# Patient Record
Sex: Female | Born: 1970 | Hispanic: No | Marital: Single | Smoking: Never smoker
Health system: Southern US, Community
[De-identification: ages and names within clinical notes are randomized; demographics above are authoritative.]

## PROBLEM LIST (undated history)

## (undated) DIAGNOSIS — N390 Urinary tract infection, site not specified: Secondary | ICD-10-CM

## (undated) HISTORY — DX: Urinary tract infection, site not specified: N39.0

---

## 2015-11-03 ENCOUNTER — Other Ambulatory Visit (HOSPITAL_COMMUNITY): Admission: AD | Admit: 2015-11-03 | Payer: Self-pay | Source: Ambulatory Visit | Admitting: Family Medicine

## 2015-11-03 ENCOUNTER — Emergency Department (INDEPENDENT_AMBULATORY_CARE_PROVIDER_SITE_OTHER)
Admission: EM | Admit: 2015-11-03 | Discharge: 2015-11-03 | Disposition: A | Discharge status: Home / Self Care | Payer: BLUE CROSS/BLUE SHIELD | Source: Home / Self Care | Attending: Family Medicine | Admitting: Family Medicine

## 2015-11-03 ENCOUNTER — Other Ambulatory Visit (HOSPITAL_COMMUNITY)
Admission: RE | Admit: 2015-11-03 | Discharge: 2015-11-03 | Disposition: A | Discharge status: Home / Self Care | Payer: BLUE CROSS/BLUE SHIELD | Source: Ambulatory Visit | Attending: Family Medicine | Admitting: Family Medicine

## 2015-11-03 DIAGNOSIS — N390 Urinary tract infection, site not specified: Secondary | ICD-10-CM | POA: Diagnosis present

## 2015-11-03 LAB — POCT URINALYSIS DIP (DEVICE)
BILIRUBIN URINE: NEGATIVE
Glucose, UA: NEGATIVE mg/dL
KETONES UR: NEGATIVE mg/dL
Nitrite: NEGATIVE
PH: 8.5 — AB (ref 5.0–8.0)
Protein, ur: NEGATIVE mg/dL
Specific Gravity, Urine: 1.015 (ref 1.005–1.030)
Urobilinogen, UA: 0.2 mg/dL (ref 0.0–1.0)

## 2015-11-03 MED ORDER — CEPHALEXIN 500 MG PO CAPS: 500.0000 mg | ORAL_CAPSULE | Freq: Four times a day (QID) | ORAL | Status: DC

## 2015-11-03 NOTE — Discharge Instructions (Signed)
Antibiotic Medicine Also AZO-Standard for urinary symptoms Antibiotic medicines are used to treat infections caused by bacteria. They work by injuring or killing the bacteria that is making you sick. HOW IS AN ANTIBIOTIC CHOSEN? An antibiotic is chosen based on many factors. To help your health care provider choose one for you, tell your health care provider if:  You have any allergies.  You are pregnant or plan to get pregnant.  You are breastfeeding.  You are taking any medicines. These include over-the-counter medicines, prescription medicines, and herbal remedies.  You have a medical condition or problem you have not already discussed. Your health care provider will also consider:  How often the medicine has to be taken.  Common side effects of the medicine.  The cost of the medicine.  The taste of the medicine. If you have questions about why an antibiotic was chosen, make sure to ask. FOR HOW LONG SHOULD I TAKE MY ANTIBIOTIC? Continue to take your antibiotic for as long as told by your health care provider. Do not stop taking it when you feel better. If you stop taking it too soon:  You may start to feel sick again.  Your infection may become harder to treat.  Complications may develop. WHAT IF I MISS A DOSE? Try not to miss any doses of medicine. If you miss a dose, take it as soon as possible. However, if it is almost time for the next dose:  If you are taking 2 doses per day, take the missed dose and the next dose 5 to 6 hours apart.  If you are taking 3 or more doses per day, take the missed dose and the next dose 2 to 4 hours apart, then go back to the normal schedule. If you cannot make up a missed dose, take the next scheduled dose on time. Then take the missed dose after you have taken all the doses as recommended by your health care provider, as if you had one more dose left. DO ANTIBIOTICS AFFECT BIRTH CONTROL? Birth control pills may not work while you are on  antibiotics. If you are taking birth control pills, continue taking them as usual and use a second form of birth control, such as a condom, to avoid unwanted pregnancy. Continue using the second form of birth control until you are finished with your current 1 month cycle of birth control pills. OTHER INFORMATION  If there is any medicine left over, throw it away.  Never take someone else's antibiotics.  Never take leftover antibiotics. SEEK MEDICAL CARE IF:  You get worse.  You do not feel better within a few days of starting the antibiotic medicine.  You vomit.  White patches appear in your mouth.  You have new joint pain that begins after starting the antibiotic.  You have new muscle aches that begin after starting the antibiotic.  You had a fever before starting the antibiotic and it returns.  You have any symptoms of an allergic reaction, such as an itchy rash. If this happens, stop taking the antibiotic. SEEK IMMEDIATE MEDICAL CARE IF:  Your urine turns dark or becomes blood-colored.  Your skin turns yellow.  You bruise or bleed easily.  You have severe diarrhea and abdominal cramps.  You have a severe headache.  You have signs of a severe allergic reaction, such as:  Trouble breathing.  Wheezing.  Swelling of the lips, tongue, or face.  Fainting.  Blisters on the skin or in the mouth. If you have  signs of a severe allergic reaction, stop taking the antibiotic right away.   This information is not intended to replace advice given to you by your health care provider. Make sure you discuss any questions you have with your health care provider.   Document Released: 06/30/2004 Document Revised: 07/09/2015 Document Reviewed: 03/05/2015 Elsevier Interactive Patient Education 2016 [employer]  Urinary Tract Infection Urinary tract infections (UTIs) can develop anywhere along your urinary tract. Your urinary tract is your body's drainage system for removing  wastes and extra water. Your urinary tract includes two kidneys, two ureters, a bladder, and a urethra. Your kidneys are a pair of bean-shaped organs. Each kidney is about the size of your fist. They are located below your ribs, one on each side of your spine. CAUSES Infections are caused by microbes, which are microscopic organisms, including fungi, viruses, and bacteria. These organisms are so small that they can only be seen through a microscope. Bacteria are the microbes that most commonly cause UTIs. SYMPTOMS  Symptoms of UTIs may vary by age and gender of the patient and by the location of the infection. Symptoms in young women typically include a frequent and intense urge to urinate and a painful, burning feeling in the bladder or urethra during urination. Older women and men are more likely to be tired, shaky, and weak and have muscle aches and abdominal pain. A fever may mean the infection is in your kidneys. Other symptoms of a kidney infection include pain in your back or sides below the ribs, nausea, and vomiting. DIAGNOSIS To diagnose a UTI, your caregiver will ask you about your symptoms. Your caregiver will also ask you to provide a urine sample. The urine sample will be tested for bacteria and white blood cells. White blood cells are made by your body to help fight infection. TREATMENT  Typically, UTIs can be treated with medication. Because most UTIs are caused by a bacterial infection, they usually can be treated with the use of antibiotics. The choice of antibiotic and length of treatment depend on your symptoms and the type of bacteria causing your infection. HOME CARE INSTRUCTIONS  If you were prescribed antibiotics, take them exactly as your caregiver instructs you. Finish the medication even if you feel better after you have only taken some of the medication.  Drink enough water and fluids to keep your urine clear or pale yellow.  Avoid caffeine, tea, and carbonated beverages.  They tend to irritate your bladder.  Empty your bladder often. Avoid holding urine for long periods of time.  Empty your bladder before and after sexual intercourse.  After a bowel movement, women should cleanse from front to back. Use each tissue only once. SEEK MEDICAL CARE IF:   You have back pain.  You develop a fever.  Your symptoms do not begin to resolve within 3 days. SEEK IMMEDIATE MEDICAL CARE IF:   You have severe back pain or lower abdominal pain.  You develop chills.  You have nausea or vomiting.  You have continued burning or discomfort with urination. MAKE SURE YOU:   Understand these instructions.  Will watch your condition.  Will get help right away if you are not doing well or get worse.   This information is not intended to replace advice given to you by your health care provider. Make sure you discuss any questions you have with your health care provider.   Document Released: 07/28/2005 Document Revised: 07/09/2015 Document Reviewed: 11/26/2011 Elsevier Interactive Patient Education 2016  Elsevier Inc[employer] ° °

## 2015-11-03 NOTE — ED Provider Notes (Signed)
CSN: [id number]     Arrival date & time 11/03/15  1846 History   First MD Initiated Contact with Patient 11/03/15 2046     Chief Complaint  Patient presents with  . Recurrent UTI   (Consider location/radiation/quality/duration/timing/severity/associated sxs/prior Treatment) HPI Comments:   45 year old female complaining of cloudy urine and dysuria. She states she has recurring UTIs particularly after having intercourse. The prior to UTIs that she has had was treated with Septra and the second one with Bactroban. He tended to recur.   No past medical history on file. No past surgical history on file. No family history on file. Social History  Substance Use Topics  . Smoking status: Not on file  . Smokeless tobacco: Not on file  . Alcohol Use: Not on file   OB History    No data available     Review of Systems  Constitutional: Negative for fever, activity change and fatigue.  HENT: Negative.   Respiratory: Negative.   Gastrointestinal: Negative.   Genitourinary: Positive for dysuria. Negative for vaginal discharge, vaginal pain and pelvic pain.  Neurological: Negative.     Allergies  Review of patient's allergies indicates not on file.  Home Medications   Prior to Admission medications   Medication Sig Start Date End Date Taking? Authorizing Provider  cephALEXin (KEFLEX) 500 MG capsule Take 1 capsule (500 mg total) by mouth 4 (four) times daily. 11/03/15   Hayden Rasmussenavid Dixie Jafri, NP   Meds Ordered and Administered this Visit  Medications - No data to display  BP 145/79 mmHg  Pulse 88  Temp(Src) 100.6 F (38.1 C) (Oral)  Resp 17  SpO2 99% No data found.   Physical Exam  Constitutional: She is oriented to person, place, and time. She appears well-developed and well-nourished. No distress.  Eyes: EOM are normal.  Neck: Normal range of motion. Neck supple.  Cardiovascular: Normal rate.   Pulmonary/Chest: Effort normal. No respiratory distress.  Musculoskeletal: She exhibits no  edema.  Neurological: She is alert and oriented to person, place, and time. She exhibits normal muscle tone.  Skin: Skin is warm and dry.  Psychiatric: She has a normal mood and affect.  Nursing note and vitals reviewed.   ED Course  Procedures (including critical care time)  Labs Review Labs Reviewed  POCT URINALYSIS DIP (DEVICE) - Abnormal; Notable for the following:    Hgb urine dipstick TRACE (*)    pH 8.5 (*)    Leukocytes, UA MODERATE (*)    All other components within normal limits  URINE CULTURE    Imaging Review No results found.   Visual Acuity Review  Right Eye Distance:   Left Eye Distance:   Bilateral Distance:    Right Eye Near:   Left Eye Near:    Bilateral Near:         MDM   1. UTI (lower urinary tract infection)    Keflex 500 mg 4 times a day per Rx Recommend taking AZO as directed Drink plenty fluids and stay well-hydrated May need to follow-up with PCP for recurrent UTIs. Urine culture pending.    Hayden Rasmussenavid Stanisha Lorenz, NP 11/03/15 2109

## 2015-11-03 NOTE — ED Notes (Signed)
Patient complains of having cloudy urine, some pain and burning when she voids Patient states she does have recurrent  UTI's Patient had one in November and another one in Dec

## 2015-11-05 LAB — URINE CULTURE

## 2015-11-13 ENCOUNTER — Other Ambulatory Visit (INDEPENDENT_AMBULATORY_CARE_PROVIDER_SITE_OTHER): Payer: BLUE CROSS/BLUE SHIELD

## 2015-11-13 ENCOUNTER — Ambulatory Visit (INDEPENDENT_AMBULATORY_CARE_PROVIDER_SITE_OTHER): Payer: BLUE CROSS/BLUE SHIELD | Admitting: Internal Medicine

## 2015-11-13 ENCOUNTER — Encounter: Payer: Self-pay | Admitting: Internal Medicine

## 2015-11-13 VITALS — BP 144/70 | HR 82 | Temp 99.2°F | Resp 16 | Ht 67.0 in | Wt 152.0 lb

## 2015-11-13 DIAGNOSIS — Z0000 Encounter for general adult medical examination without abnormal findings: Secondary | ICD-10-CM | POA: Diagnosis not present

## 2015-11-13 DIAGNOSIS — N390 Urinary tract infection, site not specified: Secondary | ICD-10-CM | POA: Diagnosis not present

## 2015-11-13 DIAGNOSIS — K625 Hemorrhage of anus and rectum: Secondary | ICD-10-CM

## 2015-11-13 DIAGNOSIS — R350 Frequency of micturition: Secondary | ICD-10-CM

## 2015-11-13 LAB — POCT URINALYSIS DIPSTICK
BILIRUBIN UA: NEGATIVE
Blood, UA: NEGATIVE
Glucose, UA: NEGATIVE
KETONES UA: NEGATIVE
LEUKOCYTES UA: NEGATIVE
Nitrite, UA: NEGATIVE
PH UA: 6
Protein, UA: NEGATIVE
SPEC GRAV UA: 1.02
Urobilinogen, UA: NEGATIVE

## 2015-11-13 LAB — COMPREHENSIVE METABOLIC PANEL
ALT: 13 U/L (ref 0–35)
AST: 17 U/L (ref 0–37)
Albumin: 4.4 g/dL (ref 3.5–5.2)
Alkaline Phosphatase: 54 U/L (ref 39–117)
BILIRUBIN TOTAL: 0.5 mg/dL (ref 0.2–1.2)
BUN: 13 mg/dL (ref 6–23)
CO2: 27 meq/L (ref 19–32)
CREATININE: 0.76 mg/dL (ref 0.40–1.20)
Calcium: 9.6 mg/dL (ref 8.4–10.5)
Chloride: 103 mEq/L (ref 96–112)
GFR: 87.53 mL/min (ref >60.00)
GLUCOSE: 94 mg/dL (ref 70–99)
Potassium: 4.2 mEq/L (ref 3.5–5.1)
SODIUM: 136 meq/L (ref 135–145)
TOTAL PROTEIN: 7.6 g/dL (ref 6.0–8.3)

## 2015-11-13 LAB — CBC
HCT: 39.7 % (ref 36.0–46.0)
Hemoglobin: 13.3 g/dL (ref 12.0–15.0)
MCHC: 33.5 g/dL (ref 30.0–36.0)
MCV: 87.3 fl (ref 78.0–100.0)
PLATELETS: 330 10*3/uL (ref 150.0–400.0)
RBC: 4.55 Mil/uL (ref 3.87–5.11)
RDW: 13.9 % (ref 11.5–15.5)
WBC: 6 10*3/uL (ref 4.0–10.5)

## 2015-11-13 LAB — LIPID PANEL
CHOL/HDL RATIO: 3
Cholesterol: 210 mg/dL — ABNORMAL HIGH (ref 0–200)
HDL: 69.5 mg/dL (ref >39.00)
LDL Cholesterol: 121 mg/dL — ABNORMAL HIGH (ref 0–99)
NONHDL: 140.26
Triglycerides: 98 mg/dL (ref 0.0–149.0)
VLDL: 19.6 mg/dL (ref 0.0–40.0)

## 2015-11-13 LAB — TSH: TSH: 3.77 u[IU]/mL (ref 0.35–4.50)

## 2015-11-13 LAB — VITAMIN D 25 HYDROXY (VIT D DEFICIENCY, FRACTURES): VITD: 25.09 ng/mL — AB (ref 30.00–100.00)

## 2015-11-13 MED ORDER — SULFAMETHOXAZOLE-TRIMETHOPRIM 400-80 MG PO TABS: 1.0000 | ORAL_TABLET | Freq: Every day | ORAL | Status: DC | PRN

## 2015-11-13 NOTE — Patient Instructions (Signed)
We have sent in the medicine for the utis that you can use after intercourse. Take 1 pill of the bactrim as needed. This should prevent urinary tract infections. Urinating right after intercourse also helps to decrease risk of infection.   We will get you in with the stomach doctor to see if they think you need a colonoscopy to check out the bleeding.   Health Maintenance, Female Adopting a healthy lifestyle and getting preventive care can go a long way to promote health and wellness. Talk with your health care provider about what schedule of regular examinations is right for you. This is a good chance for you to check in with your provider about disease prevention and staying healthy. In between checkups, there are plenty of things you can do on your own. Experts have done a lot of research about which lifestyle changes and preventive measures are most likely to keep you healthy. Ask your health care provider for more information. WEIGHT AND DIET  Eat a healthy diet  Be sure to include plenty of vegetables, fruits, low-fat dairy products, and lean protein.  Do not eat a lot of foods high in solid fats, added sugars, or salt.  Get regular exercise. This is one of the most important things you can do for your health.  Most adults should exercise for at least 150 minutes each week. The exercise should increase your heart rate and make you sweat (moderate-intensity exercise).  Most adults should also do strengthening exercises at least twice a week. This is in addition to the moderate-intensity exercise.  Maintain a healthy weight  Body mass index (BMI) is a measurement that can be used to identify possible weight problems. It estimates body fat based on height and weight. Your health care provider can help determine your BMI and help you achieve or maintain a healthy weight.  For females 55 years of age and older:   A BMI below 18.5 is considered underweight.  A BMI of 18.5 to 24.9 is  normal.  A BMI of 25 to 29.9 is considered overweight.  A BMI of 30 and above is considered obese.  Watch levels of cholesterol and blood lipids  You should start having your blood tested for lipids and cholesterol at 45 years of age, then have this test every 5 years.  You may need to have your cholesterol levels checked more often if:  Your lipid or cholesterol levels are high.  You are older than 45 years of age.  You are at high risk for heart disease.  CANCER SCREENING   Lung Cancer  Lung cancer screening is recommended for adults 78-69 years old who are at high risk for lung cancer because of a history of smoking.  A yearly low-dose CT scan of the lungs is recommended for people who:  Currently smoke.  Have quit within the past 15 years.  Have at least a 30-pack-year history of smoking. A pack year is smoking an average of one pack of cigarettes a day for 1 year.  Yearly screening should continue until it has been 15 years since you quit.  Yearly screening should stop if you develop a health problem that would prevent you from having lung cancer treatment.  Breast Cancer  Practice breast self-awareness. This means understanding how your breasts normally appear and feel.  It also means doing regular breast self-exams. Let your health care provider know about any changes, no matter how small.  If you are in your 80s or  44s, you should have a clinical breast exam (CBE) by a health care provider every 1-3 years as part of a regular health exam.  If you are 41 or older, have a CBE every year. Also consider having a breast X-ray (mammogram) every year.  If you have a family history of breast cancer, talk to your health care provider about genetic screening.  If you are at high risk for breast cancer, talk to your health care provider about having an MRI and a mammogram every year.  Breast cancer gene (BRCA) assessment is recommended for women who have family members  with BRCA-related cancers. BRCA-related cancers include:  Breast.  Ovarian.  Tubal.  Peritoneal cancers.  Results of the assessment will determine the need for genetic counseling and BRCA1 and BRCA2 testing. Cervical Cancer Your health care provider may recommend that you be screened regularly for cancer of the pelvic organs (ovaries, uterus, and vagina). This screening involves a pelvic examination, including checking for microscopic changes to the surface of your cervix (Pap test). You may be encouraged to have this screening done every 3 years, beginning at age 56.  For women ages 2-65, health care providers may recommend pelvic exams and Pap testing every 3 years, or they may recommend the Pap and pelvic exam, combined with testing for human papilloma virus (HPV), every 5 years. Some types of HPV increase your risk of cervical cancer. Testing for HPV may also be done on women of any age with unclear Pap test results.  Other health care providers may not recommend any screening for nonpregnant women who are considered low risk for pelvic cancer and who do not have symptoms. Ask your health care provider if a screening pelvic exam is right for you.  If you have had past treatment for cervical cancer or a condition that could lead to cancer, you need Pap tests and screening for cancer for at least 20 years after your treatment. If Pap tests have been discontinued, your risk factors (such as having a new sexual partner) need to be reassessed to determine if screening should resume. Some women have medical problems that increase the chance of getting cervical cancer. In these cases, your health care provider may recommend more frequent screening and Pap tests. Colorectal Cancer  This type of cancer can be detected and often prevented.  Routine colorectal cancer screening usually begins at 45 years of age and continues through 45 years of age.  Your health care provider may recommend  screening at an earlier age if you have risk factors for colon cancer.  Your health care provider may also recommend using home test kits to check for hidden blood in the stool.  A small camera at the end of a tube can be used to examine your colon directly (sigmoidoscopy or colonoscopy). This is done to check for the earliest forms of colorectal cancer.  Routine screening usually begins at age 52.  Direct examination of the colon should be repeated every 5-10 years through 45 years of age. However, you may need to be screened more often if early forms of precancerous polyps or small growths are found. Skin Cancer  Check your skin from head to toe regularly.  Tell your health care provider about any new moles or changes in moles, especially if there is a change in a mole's shape or color.  Also tell your health care provider if you have a mole that is larger than the size of a pencil eraser.  Always  use sunscreen. Apply sunscreen liberally and repeatedly throughout the day.  Protect yourself by wearing long sleeves, pants, a wide-brimmed hat, and sunglasses whenever you are outside. HEART DISEASE, DIABETES, AND HIGH BLOOD PRESSURE   High blood pressure causes heart disease and increases the risk of stroke. High blood pressure is more likely to develop in:  People who have blood pressure in the high end of the normal range (130-139/85-89 mm Hg).  People who are overweight or obese.  People who are African American.  If you are 45-69 years of age, have your blood pressure checked every 3-5 years. If you are 45 years of age or older, have your blood pressure checked every year. You should have your blood pressure measured twice--once when you are at a hospital or clinic, and once when you are not at a hospital or clinic. Record the average of the two measurements. To check your blood pressure when you are not at a hospital or clinic, you can use:  An automated blood pressure machine at a  pharmacy.  A home blood pressure monitor.  If you are between 78 years and 62 years old, ask your health care provider if you should take aspirin to prevent strokes.  Have regular diabetes screenings. This involves taking a blood sample to check your fasting blood sugar level.  If you are at a normal weight and have a low risk for diabetes, have this test once every three years after 45 years of age.  If you are overweight and have a high risk for diabetes, consider being tested at a younger age or more often. PREVENTING INFECTION  Hepatitis B  If you have a higher risk for hepatitis B, you should be screened for this virus. You are considered at high risk for hepatitis B if:  You were born in a country where hepatitis B is common. Ask your health care provider which countries are considered high risk.  Your parents were born in a high-risk country, and you have not been immunized against hepatitis B (hepatitis B vaccine).  You have HIV or AIDS.  You use needles to inject street drugs.  You live with someone who has hepatitis B.  You have had sex with someone who has hepatitis B.  You get hemodialysis treatment.  You take certain medicines for conditions, including cancer, organ transplantation, and autoimmune conditions. Hepatitis C  Blood testing is recommended for:  Everyone born from [birth year] through 1965.  Anyone with known risk factors for hepatitis C. Sexually transmitted infections (STIs)  You should be screened for sexually transmitted infections (STIs) including gonorrhea and chlamydia if:  You are sexually active and are younger than 45 years of age.  You are older than 45 years of age and your health care provider tells you that you are at risk for this type of infection.  Your sexual activity has changed since you were last screened and you are at an increased risk for chlamydia or gonorrhea. Ask your health care provider if you are at risk.  If you do not  have HIV, but are at risk, it may be recommended that you take a prescription medicine daily to prevent HIV infection. This is called pre-exposure prophylaxis (PrEP). You are considered at risk if:  You are sexually active and do not regularly use condoms or know the HIV status of your partner(s).  You take drugs by injection.  You are sexually active with a partner who has HIV. Talk with your health care  provider about whether you are at high risk of being infected with HIV. If you choose to begin PrEP, you should first be tested for HIV. You should then be tested every 3 months for as long as you are taking PrEP.  PREGNANCY   If you are premenopausal and you may become pregnant, ask your health care provider about preconception counseling.  If you may become pregnant, take 400 to 800 micrograms (mcg) of folic acid every day.  If you want to prevent pregnancy, talk to your health care provider about birth control (contraception). OSTEOPOROSIS AND MENOPAUSE   Osteoporosis is a disease in which the bones lose minerals and strength with aging. This can result in serious bone fractures. Your risk for osteoporosis can be identified using a bone density scan.  If you are 47 years of age or older, or if you are at risk for osteoporosis and fractures, ask your health care provider if you should be screened.  Ask your health care provider whether you should take a calcium or vitamin D supplement to lower your risk for osteoporosis.  Menopause may have certain physical symptoms and risks.  Hormone replacement therapy may reduce some of these symptoms and risks. Talk to your health care provider about whether hormone replacement therapy is right for you.  HOME CARE INSTRUCTIONS   Schedule regular health, dental, and eye exams.  Stay current with your immunizations.   Do not use any tobacco products including cigarettes, chewing tobacco, or electronic cigarettes.  If you are pregnant, do not  drink alcohol.  If you are breastfeeding, limit how much and how often you drink alcohol.  Limit alcohol intake to no more than 1 drink per day for nonpregnant women. One drink equals 12 ounces of beer, 5 ounces of wine, or 1 ounces of hard liquor.  Do not use street drugs.  Do not share needles.  Ask your health care provider for help if you need support or information about quitting drugs.  Tell your health care provider if you often feel depressed.  Tell your health care provider if you have ever been abused or do not feel safe at home.   This information is not intended to replace advice given to you by your health care provider. Make sure you discuss any questions you have with your health care provider.   Document Released: 05/03/2011 Document Revised: 11/08/2014 Document Reviewed: 09/19/2013 Elsevier Interactive Patient Education [employer].

## 2015-11-13 NOTE — Progress Notes (Signed)
Pre visit review using our clinic review tool, if applicable. No additional management support is needed unless otherwise documented below in the visit note. 

## 2015-11-14 ENCOUNTER — Encounter: Payer: Self-pay | Admitting: Gastroenterology

## 2015-11-14 DIAGNOSIS — K625 Hemorrhage of anus and rectum: Secondary | ICD-10-CM | POA: Insufficient documentation

## 2015-11-14 DIAGNOSIS — N390 Urinary tract infection, site not specified: Secondary | ICD-10-CM | POA: Insufficient documentation

## 2015-11-14 NOTE — Assessment & Plan Note (Signed)
Post-coital ppx with bactrim 400/80. U/A done today without signs of infection. If still having UTI will refer to urology.

## 2015-11-14 NOTE — Progress Notes (Signed)
   Subjective:    Patient ID: Connie Pierce, female    DOB: 08/14/1971, 45 y.o.   MRN: [id number]  HPI The patient is a 45 YO female coming in new with several concerns. First is some rectal bleeding during her periods. Not a lot but enough that she can see. Does not occur between cycles. No pain with bowel movements. Denies constipation or diarrhea. No hemorrhoids that she knows of.  Next concern is frequent UTIs. She does have a lot in the past and almost monthly since being with a new partner and this is limiting her sexual drive. She has been treated and recovered but only stays free of infection 2-3 weeks. Is urinating after intercourse. Denies symptoms today and no vaginal discharge.   [facility], [facility] reviewed and updated.   Review of Systems  Constitutional: Negative for fever, activity change, appetite change, fatigue and unexpected weight change.  HENT: Negative.   Eyes: Negative.   Respiratory: Negative for cough, chest tightness, shortness of breath and wheezing.   Cardiovascular: Negative for chest pain, palpitations and leg swelling.  Gastrointestinal: Positive for anal bleeding. Negative for nausea, abdominal pain, diarrhea, blood in stool, abdominal distention and rectal pain.  Genitourinary:       Recurrent UTIs  Musculoskeletal: Negative.   Skin: Negative.   Neurological: Negative.   Psychiatric/Behavioral: Negative.       Objective:   Physical Exam  Constitutional: She is oriented to person, place, and time. She appears well-developed and well-nourished.  HENT:  Head: Normocephalic and atraumatic.  Eyes: EOM are normal.  Neck: Normal range of motion.  Cardiovascular: Normal rate and regular rhythm.   No murmur heard. Pulmonary/Chest: Effort normal and breath sounds normal. No respiratory distress. She has no wheezes. She has no rales.  Abdominal: Soft. Bowel sounds are normal. She exhibits no distension. There is no tenderness. There is no rebound.    Musculoskeletal: She exhibits no edema.  Neurological: She is alert and oriented to person, place, and time. Coordination normal.  Skin: Skin is warm and dry.  Psychiatric: She has a normal mood and affect.   Filed Vitals:   11/13/15 1410  BP: 144/70  Pulse: 82  Temp: 99.2 F (37.3 C)  TempSrc: Oral  Resp: 16  Height: 5\' 7"  (1.702 m)  Weight: 152 lb (68.947 kg)  SpO2: 99%      Assessment & Plan:

## 2015-11-14 NOTE — Assessment & Plan Note (Signed)
Refer to GI for evaluation, could be endometriosis that is causing the bleeding with cycles. She is concerned about mass or something and reasonable. Checking CBC today and routine labs.

## 2016-01-13 ENCOUNTER — Ambulatory Visit: Payer: BLUE CROSS/BLUE SHIELD | Admitting: Gastroenterology

## 2016-05-03 ENCOUNTER — Other Ambulatory Visit (HOSPITAL_COMMUNITY): Payer: Self-pay | Admitting: Obstetrics and Gynecology

## 2016-05-03 DIAGNOSIS — Z3141 Encounter for fertility testing: Secondary | ICD-10-CM

## 2016-05-06 ENCOUNTER — Ambulatory Visit (HOSPITAL_COMMUNITY)
Admission: RE | Admit: 2016-05-06 | Discharge: 2016-05-06 | Disposition: A | Discharge status: Home / Self Care | Payer: BLUE CROSS/BLUE SHIELD | Source: Ambulatory Visit | Attending: Obstetrics and Gynecology | Admitting: Obstetrics and Gynecology

## 2016-05-06 DIAGNOSIS — Z3141 Encounter for fertility testing: Secondary | ICD-10-CM

## 2016-05-06 DIAGNOSIS — N979 Female infertility, unspecified: Secondary | ICD-10-CM | POA: Insufficient documentation

## 2016-05-06 MED ORDER — IOPAMIDOL (ISOVUE-300) INJECTION 61%
30.0000 mL | Freq: Once | INTRAVENOUS | Status: AC | PRN
Start: 2016-05-06 — End: 2016-05-06
  Administered 2016-05-06: 4 mL

## 2017-01-24 IMAGING — RF DG HYSTEROGRAM
5 series · 5 of 5 positions shown · non-contrast
Comparison: None.

FLUOROSCOPY TIME:  Fluoroscopy Time:  36 second

Number of Acquired Images:  5

CLINICAL DATA: Infertility

EXAM:
HYSTEROSALPINGOGRAM
TECHNIQUE: Hysterosalpingogram was performed by the ordering physician under
fluoroscopy. Fluoroscopic images were submitted for radiologic
interpretation following the procedure. Please see the procedural
report for the amount of contrast and the fluoroscopy time utilized.

[Series 1: run · 1 of 1 slices shown (1 of 5)]
[im 1/1]
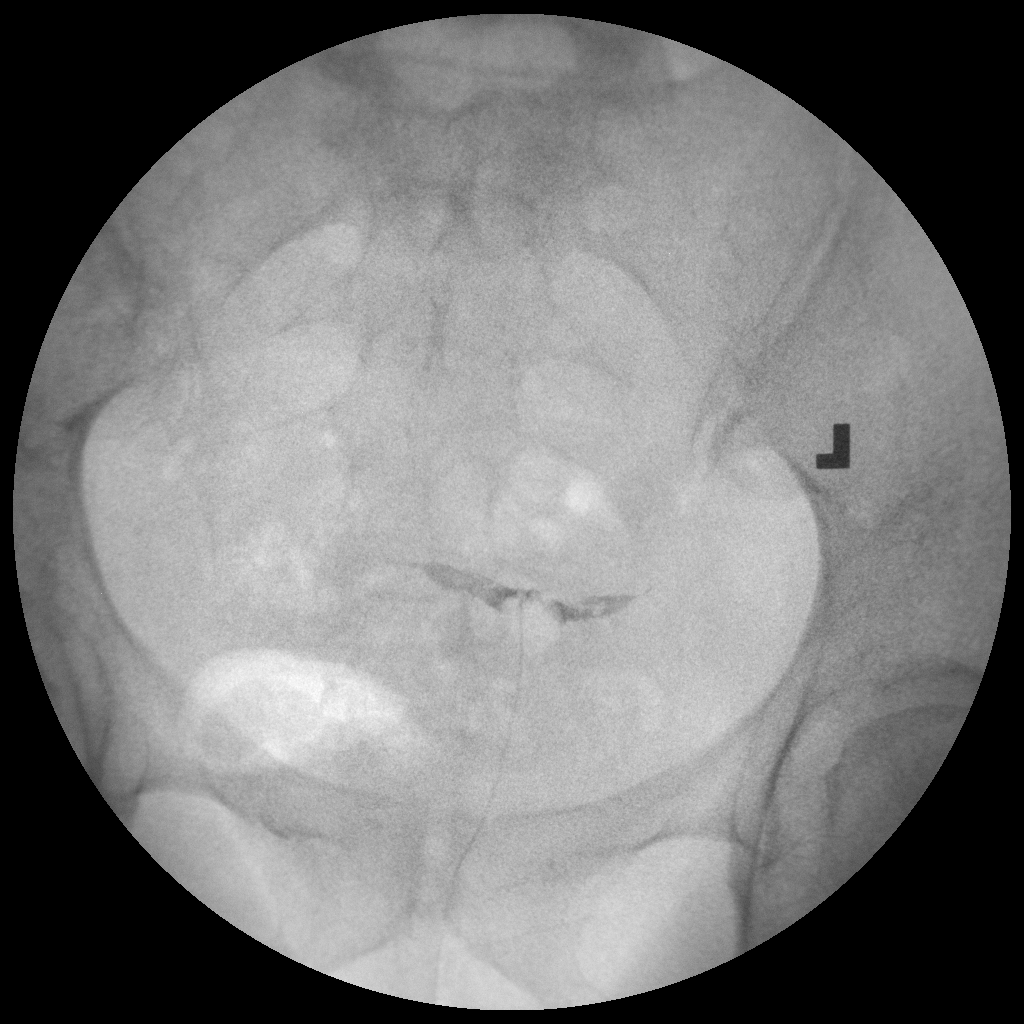

[Series 2: run · 1 of 1 slices shown (2 of 5)]
[im 1/1]
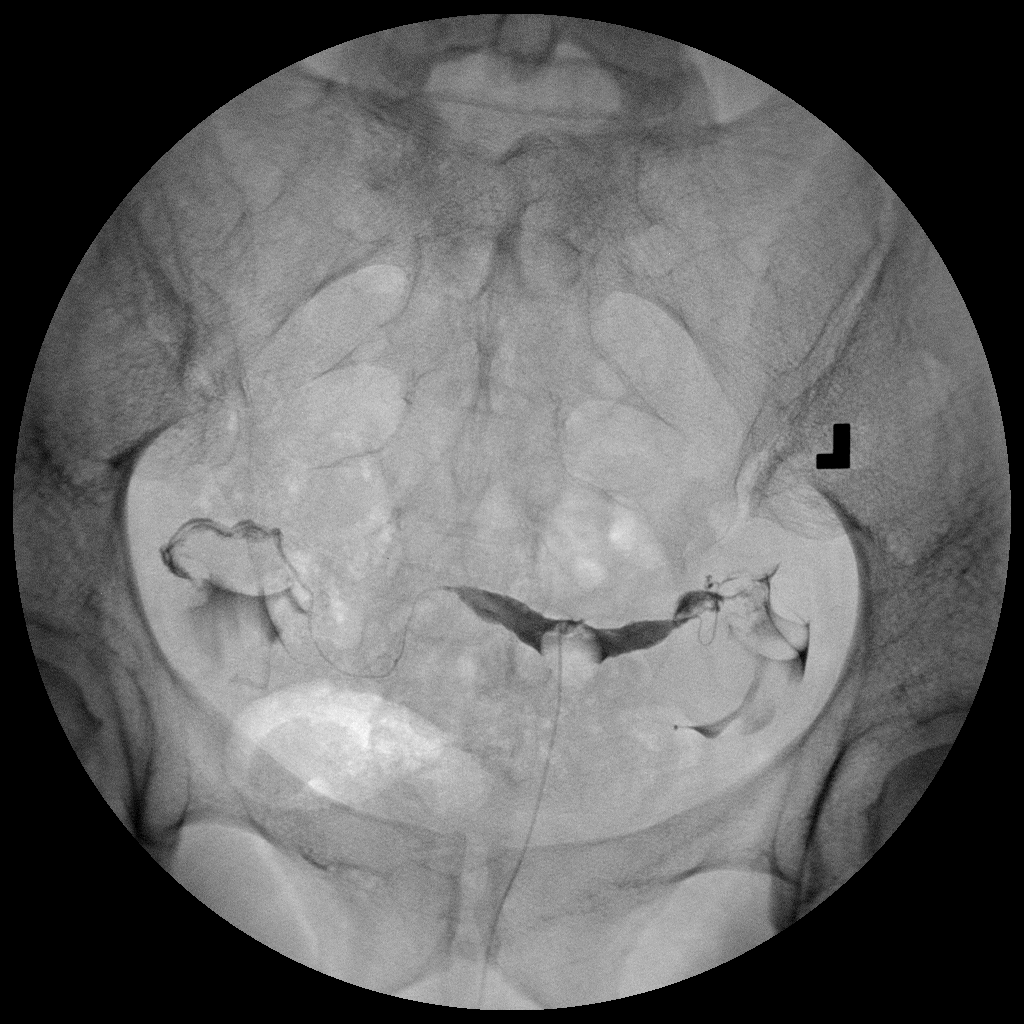

[Series 3: run · 1 of 1 slices shown (3 of 5)]
[im 1/1]
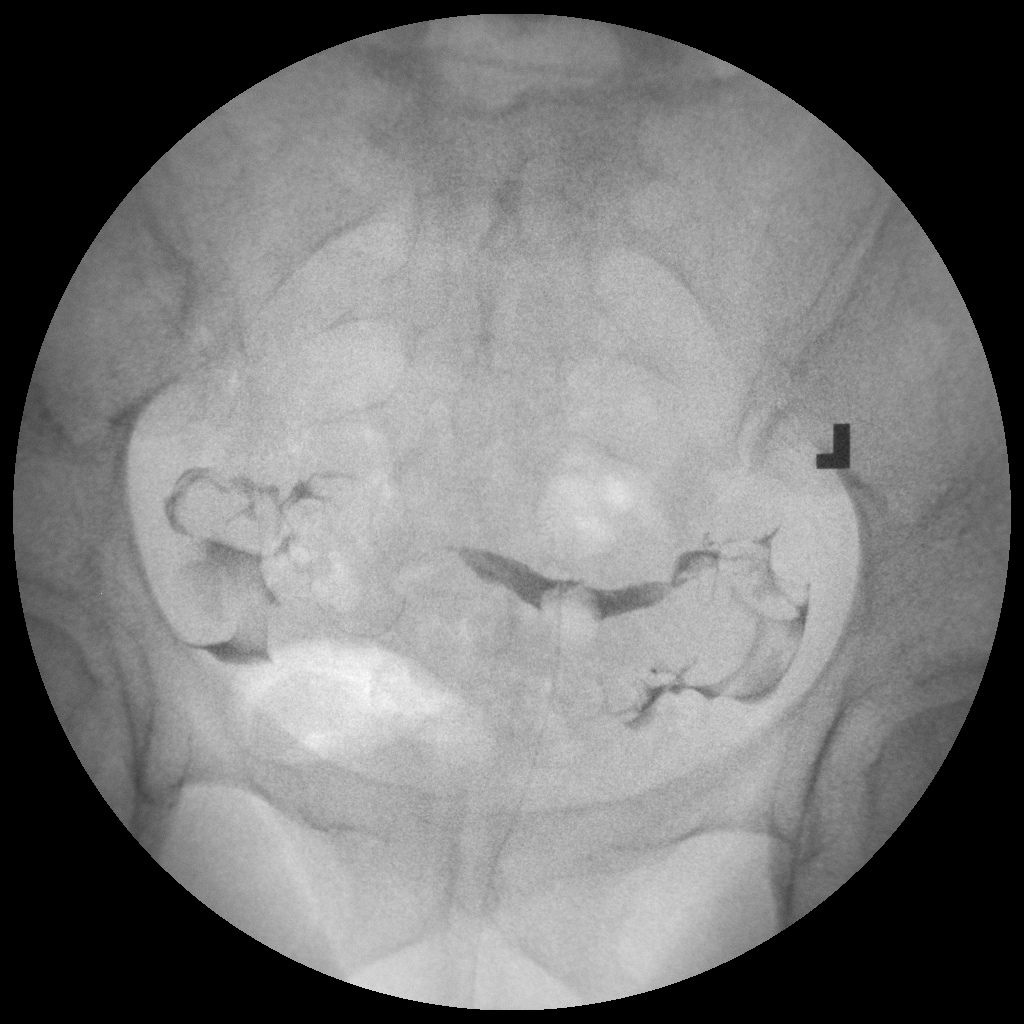

[Series 4: run · 1 of 1 slices shown (4 of 5)]
[im 1/1]
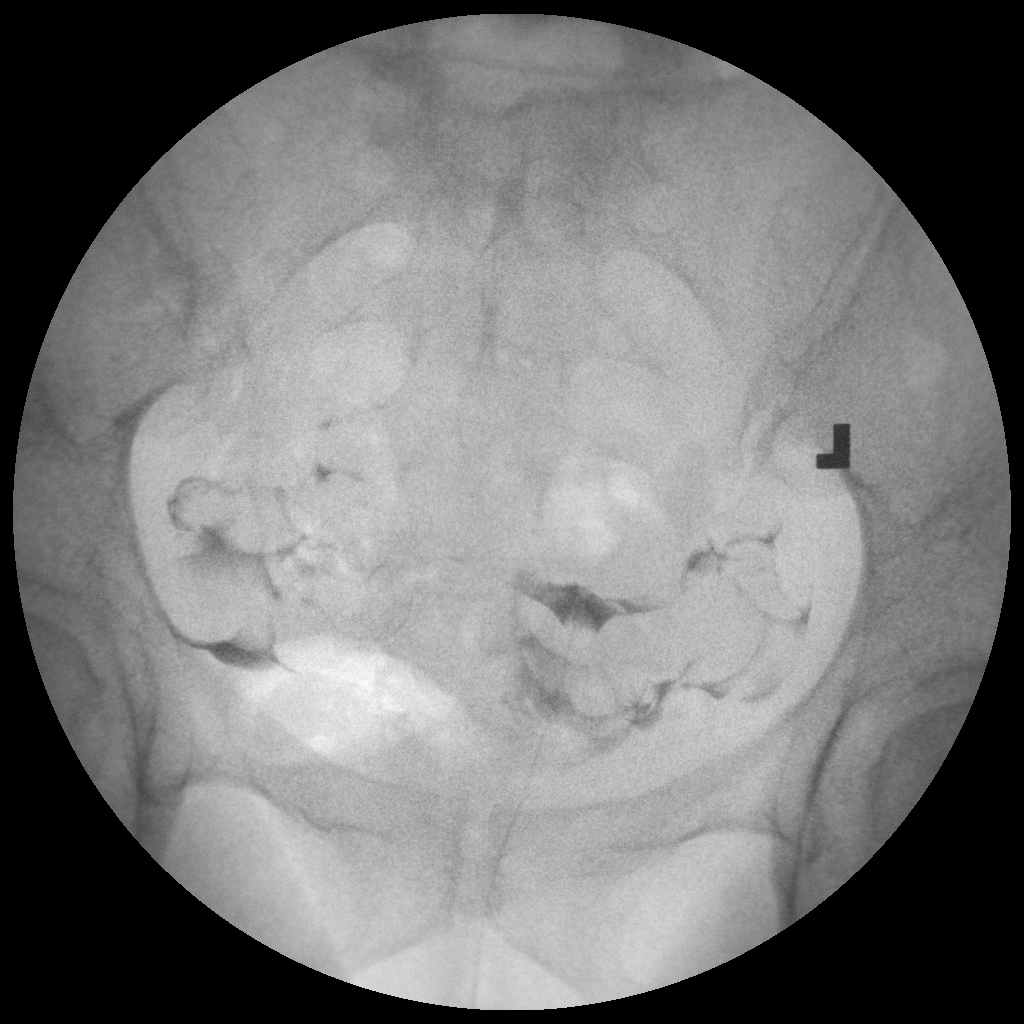

[Series 5: run · 1 of 1 slices shown (5 of 5)]
[im 1/1]
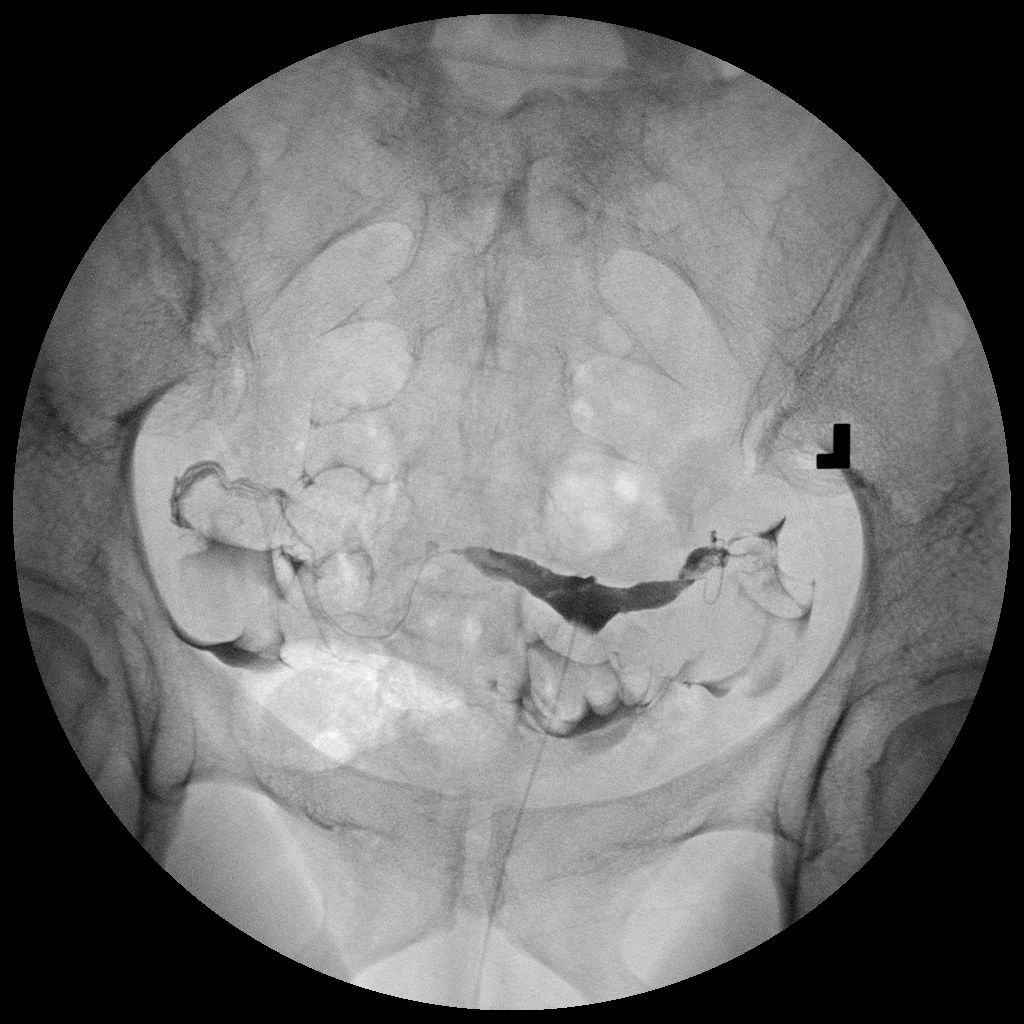

[5 of 5 positions shown; findings below may reference images not displayed]

FINDINGS: The endometrial cavity is normal in appearance and contour. No signs
of mullerian duct anomaly.

Opacification of both fallopian tubes is seen. Both tubes appear
normal. Intraperitoneal spill of contrast from both fallopian tubes
is demonstrated.
IMPRESSION: Normal study. Both fallopian tubes are patent.

## 2020-09-24 ENCOUNTER — Other Ambulatory Visit: Payer: Self-pay

## 2020-09-24 ENCOUNTER — Ambulatory Visit
Admission: EM | Admit: 2020-09-24 | Discharge: 2020-09-24 | Disposition: A | Discharge status: Home / Self Care | Payer: BC Managed Care – PPO | Attending: Family Medicine | Admitting: Family Medicine

## 2020-09-24 DIAGNOSIS — K047 Periapical abscess without sinus: Secondary | ICD-10-CM

## 2020-09-24 MED ORDER — FLUCONAZOLE 150 MG PO TABS: 150.0000 mg | ORAL_TABLET | Freq: Once | ORAL | 1 refills | Status: AC

## 2020-09-24 MED ORDER — AMOXICILLIN 875 MG PO TABS: 875.0000 mg | ORAL_TABLET | Freq: Two times a day (BID) | ORAL | 1 refills | Status: AC

## 2020-09-24 NOTE — ED Provider Notes (Signed)
[facility]    CSN: [id number] Arrival date & time: 09/24/20  1632      History   Chief Complaint Chief Complaint  Patient presents with  . Dental Pain    HPI Connie Pierce is a 49 y.o. female.   Initial [facility] visit  Pt c/o lt lower toothache x3 days. States from out of town and her Building services engineer recommended coming here to get antibiotics.    Traveling from [facility].  Had root canal tooth #18     Past Medical History:  Diagnosis Date  . UTI (urinary tract infection)     Patient Active Problem List   Diagnosis Date Noted  . Rectal bleeding 11/14/2015  . Recurrent UTI 11/14/2015    History reviewed. No pertinent surgical history.  OB History   No obstetric history on file.      Home Medications    Prior to Admission medications   Medication Sig Start Date End Date Taking? Authorizing Provider  amoxicillin (AMOXIL) 875 MG tablet Take 1 tablet (875 mg total) by mouth 2 (two) times daily. Take with food 09/24/20   Elvina Sidle, MD  fluconazole (DIFLUCAN) 150 MG tablet Take 1 tablet (150 mg total) by mouth once for 1 dose. Repeat if needed 09/24/20 09/24/20  Elvina Sidle, MD    Family History Family History  Problem Relation Age of Onset  . Stroke Father     Social History Social History   Tobacco Use  . Smoking status: Never Smoker  . Smokeless tobacco: Never Used  Substance Use Topics  . Alcohol use: No  . Drug use: No     Allergies   Patient has no known allergies.   Review of Systems Review of Systems  HENT: Positive for dental problem.      Physical Exam Triage Vital Signs ED Triage Vitals [09/24/20 1643]  Enc Vitals Group     BP (!) 163/90     Pulse Rate 91     Resp 18     Temp 98.6 F (37 C)     Temp Source Oral     SpO2 98 %     Weight      Height      Head Circumference      Peak Flow      Pain Score 5     Pain Loc      Pain Edu?      Excl. in GC?    No data found.  Updated Vital Signs BP (!)  163/90 (BP Location: Left Arm)   Pulse 91   Temp 98.6 F (37 C) (Oral)   Resp 18   LMP 09/15/2020   SpO2 98%    Physical Exam Vitals and nursing note reviewed.  Constitutional:      Appearance: Normal appearance. She is normal weight.  HENT:     Mouth/Throat:     Comments: Mild swelling at base of tooth #18 Eyes:     Conjunctiva/sclera: Conjunctivae normal.  Pulmonary:     Effort: Pulmonary effort is normal.  Musculoskeletal:        General: Normal range of motion.     Cervical back: Normal range of motion and neck supple.  Skin:    General: Skin is warm.  Neurological:     Mental Status: She is alert.      UC Treatments / Results  Labs (all labs ordered are listed, but only abnormal results are displayed) Labs Reviewed - No data to display  EKG   Radiology No results found.  Procedures Procedures (including critical care time)  Medications Ordered in UC Medications - No data to display  Initial Impression / Assessment and Plan / UC Course  I have reviewed the triage vital signs and the nursing notes.  Pertinent labs & imaging results that were available during my care of the patient were reviewed by me and considered in my medical decision making (see chart for details).    Final Clinical Impressions(s) / UC Diagnoses   Final diagnoses:  Dental infection     Discharge Instructions     You should see resolution of discomfort in 48 hours.    ED Prescriptions    Medication Sig Dispense Auth. Provider   amoxicillin (AMOXIL) 875 MG tablet Take 1 tablet (875 mg total) by mouth 2 (two) times daily. Take with food 20 tablet Elvina Sidle, MD   fluconazole (DIFLUCAN) 150 MG tablet Take 1 tablet (150 mg total) by mouth once for 1 dose. Repeat if needed 2 tablet Elvina Sidle, MD     I have reviewed the PDMP during this encounter.   Elvina Sidle, MD 09/24/20 1659

## 2020-09-24 NOTE — ED Triage Notes (Signed)
Pt c/o lt lower toothache x3 days. States from out of town and her Building services engineer recommended coming here to get antibiotics.

## 2020-09-24 NOTE — Discharge Instructions (Addendum)
You should see resolution of discomfort in 48 hours.
